# Patient Record
Sex: Male | Born: 1995 | Race: White | Hispanic: No | Marital: Single | Smoking: Former smoker
Health system: Southern US, Community
[De-identification: ages and names within clinical notes are randomized; demographics above are authoritative.]

## PROBLEM LIST (undated history)

## (undated) DIAGNOSIS — R569 Unspecified convulsions: Secondary | ICD-10-CM

---
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
## 2013-03-01 ENCOUNTER — Emergency Department: Payer: Self-pay | Admitting: Emergency Medicine

## 2014-08-14 ENCOUNTER — Encounter (HOSPITAL_COMMUNITY): Payer: Self-pay | Admitting: Emergency Medicine

## 2014-08-14 ENCOUNTER — Emergency Department (HOSPITAL_COMMUNITY)
Admission: EM | Admit: 2014-08-14 | Discharge: 2014-08-14 | Disposition: A | Discharge status: Home / Self Care | Payer: Medicaid Other | Attending: Emergency Medicine | Admitting: Emergency Medicine

## 2014-08-14 ENCOUNTER — Emergency Department (HOSPITAL_COMMUNITY): Payer: Medicaid Other

## 2014-08-14 DIAGNOSIS — Y9389 Activity, other specified: Secondary | ICD-10-CM | POA: Diagnosis not present

## 2014-08-14 DIAGNOSIS — W010XXA Fall on same level from slipping, tripping and stumbling without subsequent striking against object, initial encounter: Secondary | ICD-10-CM | POA: Diagnosis not present

## 2014-08-14 DIAGNOSIS — Y990 Civilian activity done for income or pay: Secondary | ICD-10-CM | POA: Insufficient documentation

## 2014-08-14 DIAGNOSIS — Z720 Tobacco use: Secondary | ICD-10-CM | POA: Insufficient documentation

## 2014-08-14 DIAGNOSIS — S6991XA Unspecified injury of right wrist, hand and finger(s), initial encounter: Secondary | ICD-10-CM | POA: Diagnosis not present

## 2014-08-14 DIAGNOSIS — Y9289 Other specified places as the place of occurrence of the external cause: Secondary | ICD-10-CM | POA: Diagnosis not present

## 2014-08-14 MED ORDER — OXYCODONE HCL 5 MG PO TABS
5.0000 mg | ORAL_TABLET | Freq: Once | ORAL | Status: AC
  Administered 2014-08-14: 5 mg via ORAL
  Filled 2014-08-14: qty 1

## 2014-08-14 MED ORDER — CYCLOBENZAPRINE HCL 10 MG PO TABS: 10.0000 mg | ORAL_TABLET | Freq: Two times a day (BID) | ORAL | Status: AC | PRN

## 2014-08-14 NOTE — ED Provider Notes (Signed)
CSN: [id number]     Arrival date & time 08/14/14  1351 History   This chart was scribed for non-physician practitioner, Marlon Peliffany Nickson Middlesworth, PA-C working with Doug SouSam Jacubowitz, MD, by Jarvis Morganaylor Ferguson, ED Scribe. This patient was seen in room WTR6/WTR6 and the patient's care was started at 3:17 PM.    Chief Complaint  Patient presents with  . Hand Pain    Right    The history is provided by the patient. No language interpreter was used.    HPI Comments: Jason Mccormick is a 19 y.o. male who presents to the Emergency Department complaining of constant, moderate, right hand pain due to an injury that occurred earlier today. Pt states he was picking up something at work and his foot got caught on an object and he tripped and fell and landed on his right hand. He notes the pain is worst in his right thumb and right index finger. Pt states it hurts to straighten his fingers. The pain is exacerbated by movement. He has not taken anything. Pt denies any head injury or LOC from the fall. He denies any swelling or numbness.   History reviewed. No pertinent past medical history. History reviewed. No pertinent past surgical history. No family history on file. History  Substance Use Topics  . Smoking status: Current Every Day Smoker -- 1.00 packs/day  . Smokeless tobacco: Not on file  . Alcohol Use: No    Review of Systems  Musculoskeletal: Positive for myalgias and arthralgias. Negative for joint swelling.  All other systems reviewed and are negative.   Allergies  Ibuprofen and Tylenol  Home Medications   Prior to Admission medications   Medication Sig Start Date End Date Taking? Authorizing Provider  cyclobenzaprine (FLEXERIL) 10 MG tablet Take 1 tablet (10 mg total) by mouth 2 (two) times daily as needed for muscle spasms. 08/14/14   Marlon Peliffany Mekhi Lascola, PA-C   Triage Vitals: BP 132/82 mmHg  Pulse 94  Temp(Src) 98.3 F (36.8 C) (Oral)  Resp 18  Ht 5\' 11"  (1.803 m)  Wt 170 lb (77.111 kg)  BMI  23.72 kg/m2  SpO2 100%  Physical Exam  Constitutional: He is oriented to person, place, and time. He appears well-developed and well-nourished. No distress.  HENT:  Head: Normocephalic and atraumatic.  Eyes: Conjunctivae and EOM are normal.  Neck: Neck supple. No tracheal deviation present.  Cardiovascular: Normal rate.   Pulmonary/Chest: Effort normal. No respiratory distress.  Musculoskeletal:       Right hand: He exhibits decreased range of motion, tenderness, bony tenderness and swelling. He exhibits normal two-point discrimination, normal capillary refill, no deformity and no laceration. Normal sensation noted. Decreased strength noted. He exhibits finger abduction and wrist extension trouble.  No snuff box tenderness  Neurological: He is alert and oriented to person, place, and time.  Skin: Skin is warm and dry.  Psychiatric: He has a normal mood and affect. His behavior is normal.  Nursing note and vitals reviewed.   ED Course  Procedures (including critical care time)  DIAGNOSTIC STUDIES: Oxygen Saturation is 100% on RA, normal by my interpretation.    COORDINATION OF CARE: 2:20 PM- Will order right hand x-ray.  Pt advised of plan for treatment and pt agrees.  3:16 PM- Will order medication for the pain and thumb splint that I have advised him to wear for 1-2 weeks. Also advised pt to continue to ice the area.   Labs Review Labs Reviewed - No data to display  Imaging  Review Dg Hand Complete Right  08/14/2014   CLINICAL DATA:  Larey Seat at work.  Pain in right thumb and first finger.  EXAM: RIGHT HAND - COMPLETE 3+ VIEW  COMPARISON:  None.  FINDINGS: Mild overlap of fingers on the lateral view. No acute fracture or dislocation.  IMPRESSION: No acute osseous abnormality.   Electronically Signed   By: Jeronimo Greaves M.D.   On: 08/14/2014 15:07     EKG Interpretation None      MDM   Final diagnoses:  Thumb injury, right, initial encounter   Xray shows no  fracture/dislocation. No snuff box tenderness NIV  Thumb spica wrist splint,  Oxycodone in ED RICE Pt allergic to Tylenol and Motrin Given rx for flexeril and referral to Hand.  19 y.o.Jesson Foskey Doleman's evaluation in the Emergency Department is complete. It has been determined that no acute conditions requiring further emergency intervention are present at this time. The patient/guardian have been advised of the diagnosis and plan. We have discussed signs and symptoms that warrant return to the ED, such as changes or worsening in symptoms.  Vital signs are stable at discharge. Filed Vitals:   08/14/14 1354  BP: 132/82  Pulse: 94  Temp: 98.3 F (36.8 C)  Resp: 18    Patient/guardian has voiced understanding and agreed to follow-up with the PCP or specialist.   I personally performed the services described in this documentation, which was scribed in my presence. The recorded information has been reviewed and is accurate.    Marlon Pel, PA-C 08/14/14 1529  Doug Sou, MD 08/14/14 [phone]

## 2014-08-14 NOTE — Discharge Instructions (Signed)
Finger Sprain  A finger sprain is a tear in one of the strong, fibrous tissues that connect the bones (ligaments) in your finger. The severity of the sprain depends on how much of the ligament is torn. The tear can be either partial or complete.  CAUSES   Often, sprains are a result of a fall or accident. If you extend your hands to catch an object or to protect yourself, the force of the impact causes the fibers of your ligament to stretch too much. This excess tension causes the fibers of your ligament to tear.  SYMPTOMS   You may have some loss of motion in your finger. Other symptoms include:   Bruising.   Tenderness.   Swelling.  DIAGNOSIS   In order to diagnose finger sprain, your caregiver will physically examine your finger or thumb to determine how torn the ligament is. Your caregiver may also suggest an X-ray exam of your finger to make sure no bones are broken.  TREATMENT   If your ligament is only partially torn, treatment usually involves keeping the finger in a fixed position (immobilization) for a short period. To do this, your caregiver will apply a bandage, cast, or splint to keep your finger from moving until it heals. For a partially torn ligament, the healing process usually takes 2 to 3 weeks.  If your ligament is completely torn, you may need surgery to reconnect the ligament to the bone. After surgery a cast or splint will be applied and will need to stay on your finger or thumb for 4 to 6 weeks while your ligament heals.  HOME CARE INSTRUCTIONS   Keep your injured finger elevated, when possible, to decrease swelling.   To ease pain and swelling, apply ice to your joint twice a day, for 2 to 3 days:   Put ice in a plastic bag.   Place a towel between your skin and the bag.   Leave the ice on for 15 minutes.   Only take over-the-counter or prescription medicine for pain as directed by your caregiver.   Do not wear rings on your injured finger.   Do not leave your finger unprotected  until pain and stiffness go away (usually 3 to 4 weeks).   Do not allow your cast or splint to get wet. Cover your cast or splint with a plastic bag when you shower or bathe. Do not swim.   Your caregiver may suggest special exercises for you to do during your recovery to prevent or limit permanent stiffness.  SEEK IMMEDIATE MEDICAL CARE IF:   Your cast or splint becomes damaged.   Your pain becomes worse rather than better.  MAKE SURE YOU:   Understand these instructions.   Will watch your condition.   Will get help right away if you are not doing well or get worse.  Document Released: 04/13/2004 Document Revised: 05/29/2011 Document Reviewed: 11/07/2010  [employer] Patient Information 2015 [employer]. This information is not intended to replace advice given to you by your health care provider. Make sure you discuss any questions you have with your health care provider.

## 2014-08-14 NOTE — ED Notes (Signed)
Ortho tech aware of order.  

## 2014-08-14 NOTE — ED Notes (Signed)
Pt stated he was picking up something, foot got caught on something, tripped and fell on R hand.

## 2014-08-14 NOTE — ED Notes (Signed)
Pt refused wound care, sts he washed it in the sink "i don't want anyone else touching it"

## 2014-11-11 IMAGING — CT CT HEAD WITHOUT CONTRAST
2 series · 16 of 30 positions shown, 20 images · non-contrast
Comparison: none

REASON FOR EXAM: painand decreased mvmt right arm post seizure
COMMENTS:   May transport without cardiac monitor

PROCEDURE:     CT  - CT HEAD WITHOUT CONTRAST  - October 17, 2012  [DATE]
RESULT:     Comparison:  04/28/2012, MRI 06/03/2012
TECHNIQUE: Multiple axial images from the foramen magnum to the vertex were
obtained without IV contrast.

[Series 2: without · axial · non-contrast · 0.44mm/px · z∈[-124,+12]mm · 13 of 33 slices shown, 17 images]
[im 3/33  brain]
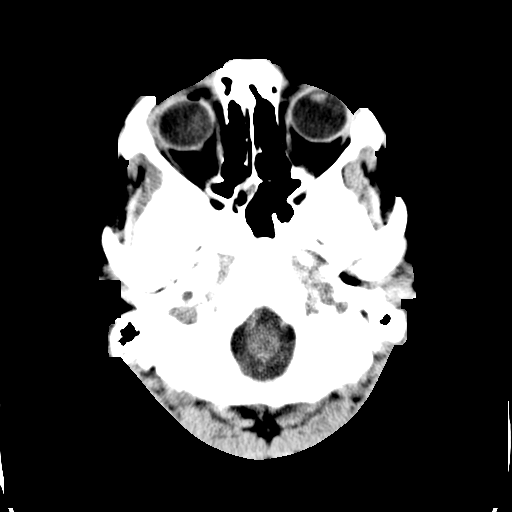
[im 3/33  bone]
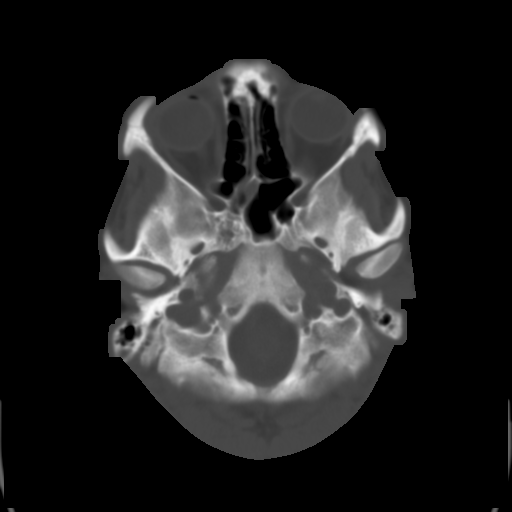
[im 5/33  brain]
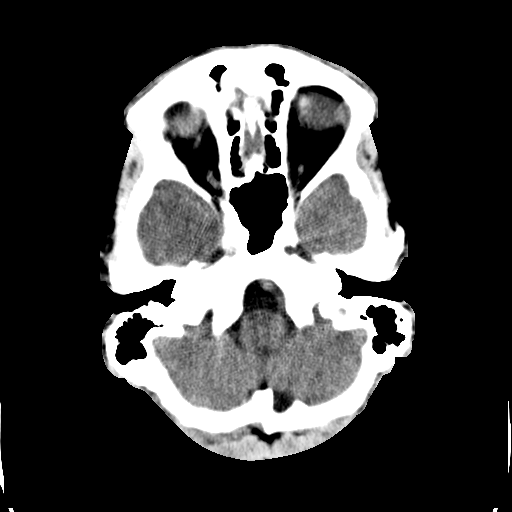
[im 7/33  brain]
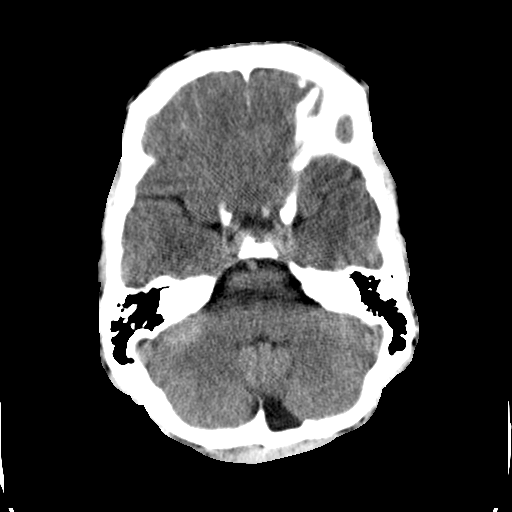
[im 10/33  brain]
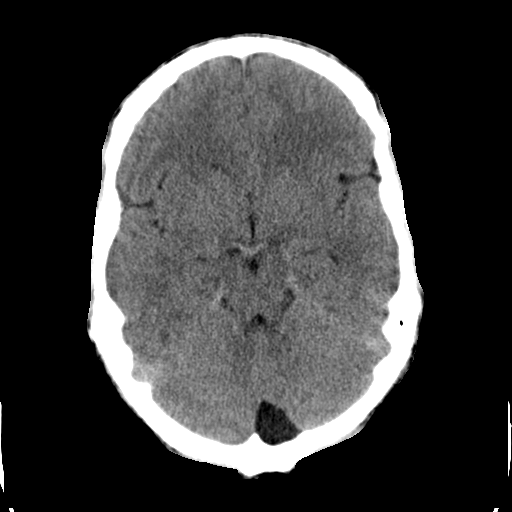
[im 12/33  brain]
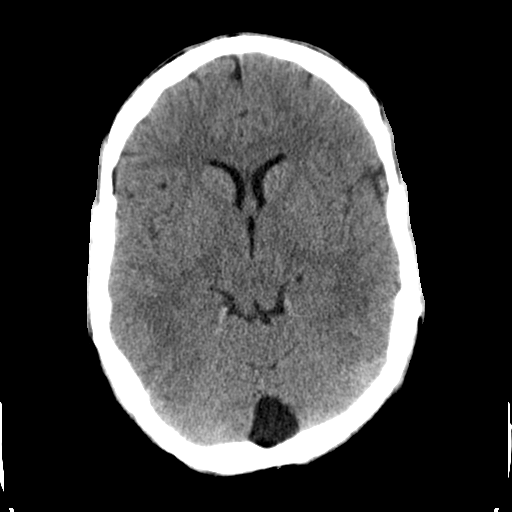
[im 12/33  bone]
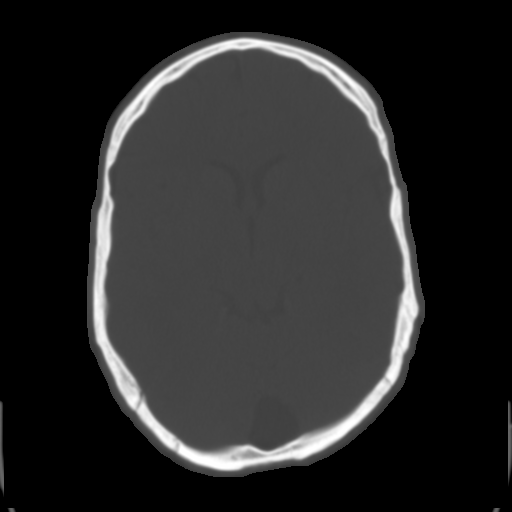
[im 14/33  brain]
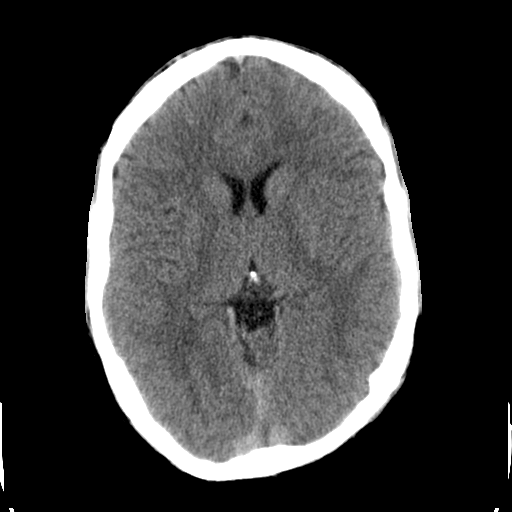
[im 17/33  brain]
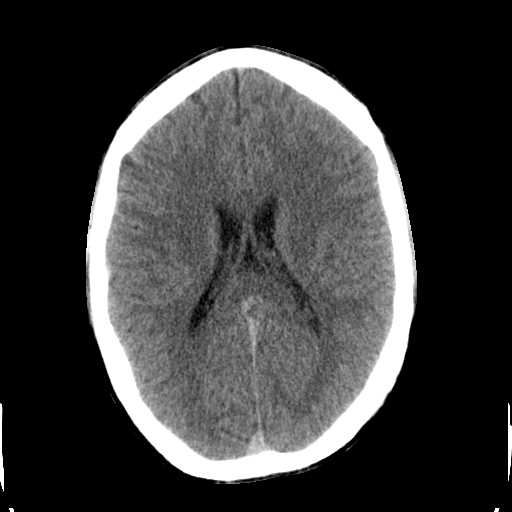
[im 19/33  brain]
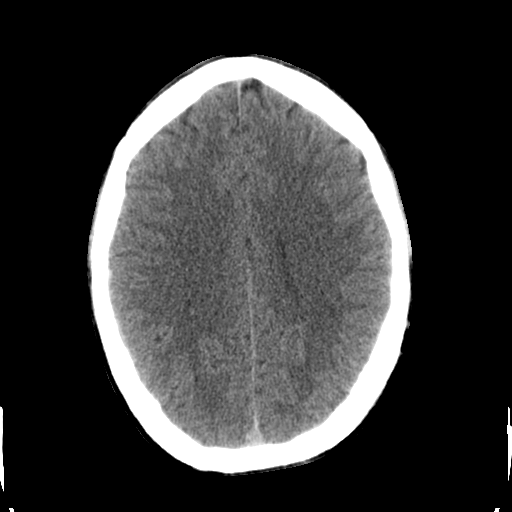
[im 21/33  brain]
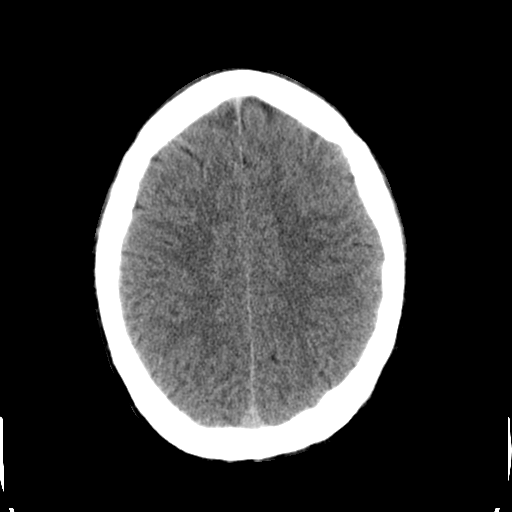
[im 21/33  bone]
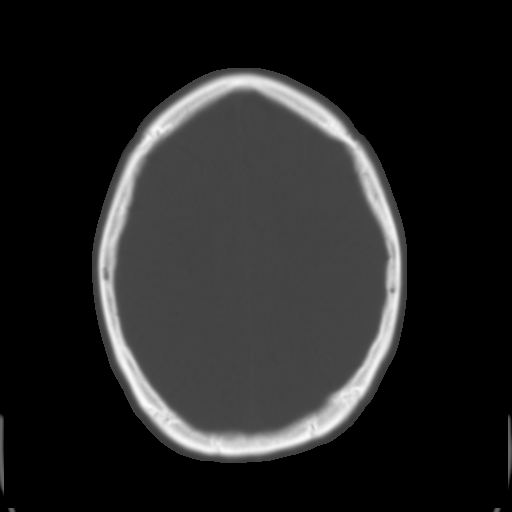
[im 23/33  brain]
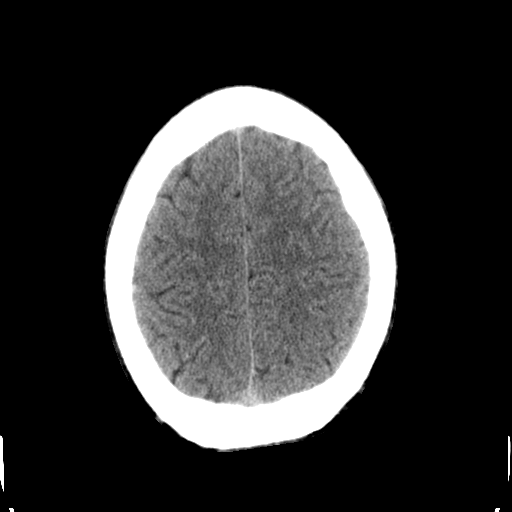
[im 26/33  brain]
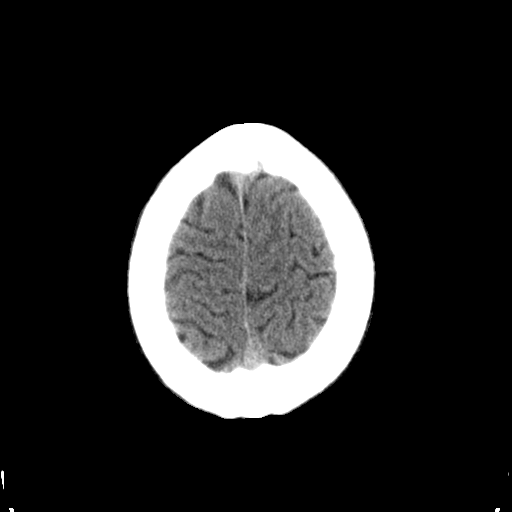
[im 28/33  brain]
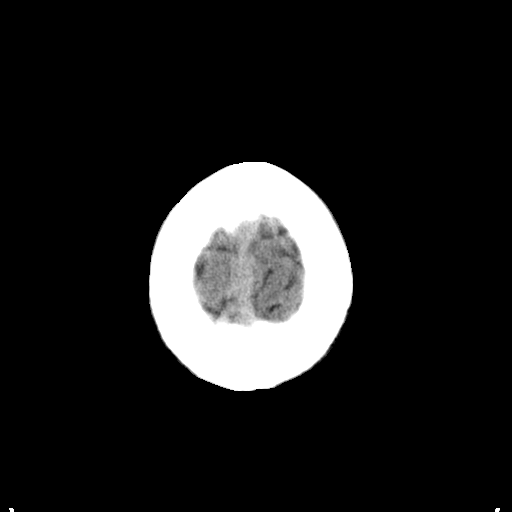
[im 30/33  brain]
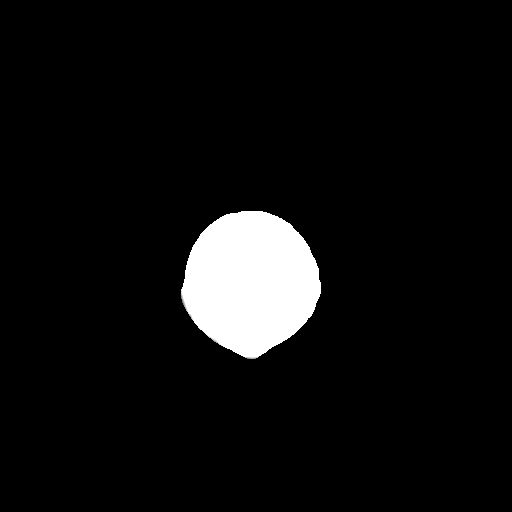
[im 30/33  bone]
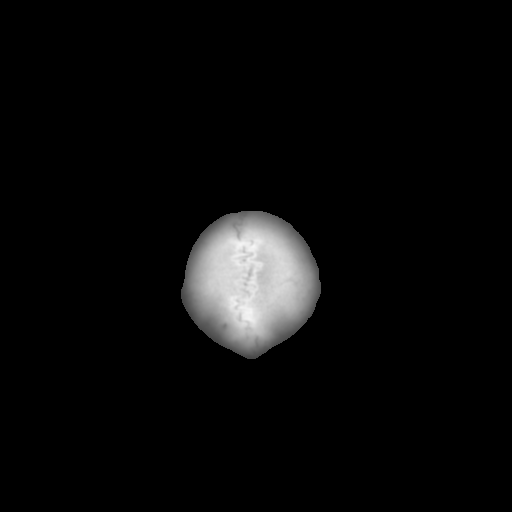

[Series 3: bone · axial · 0.44mm/px · z∈[-124,-78]mm · 3 of 33 slices shown]
[im 3/33  bone]
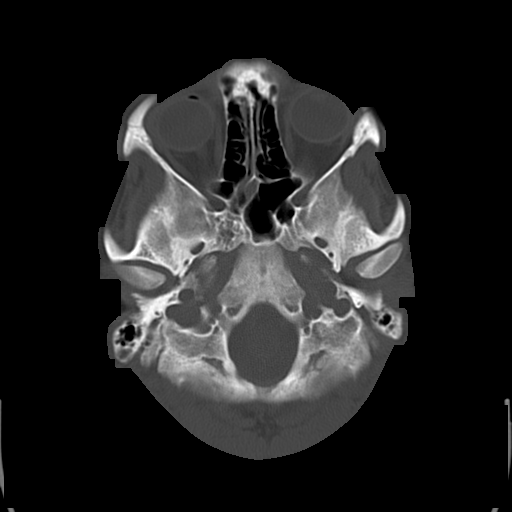
[im 7/33  bone]
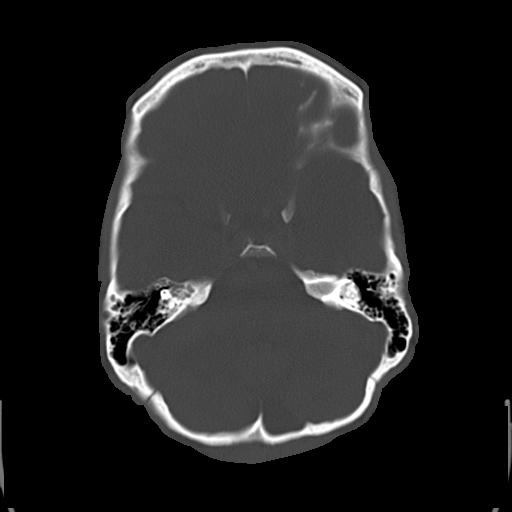
[im 12/33  bone]
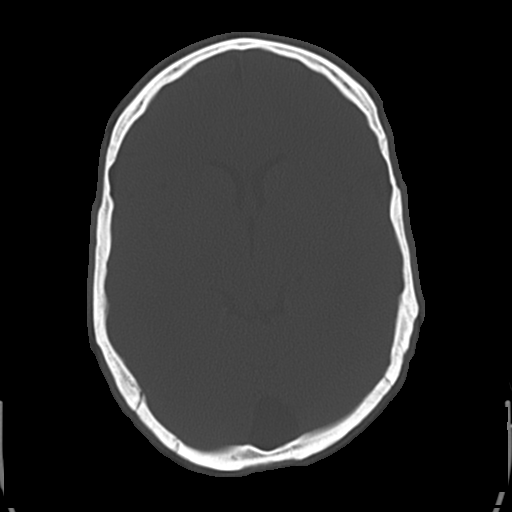

[16 of 30 positions shown; findings below may reference images not displayed]

FINDINGS: There is no evidence for midline shift or extra-axial fluid collections.
There is no evidence for intracranial hemorrhage or cortical-based area of
infarction. There are findings which likely represent a megacisterna magna
versus less likely an arachnoid cyst in the posterior fossa. This is similar
to prior.

The osseous structures are unremarkable.
IMPRESSION: 1. No acute intracranial findings.
2. There is likely a megacisterna magna versus less likely an arachnoid cyst
in the posterior fossa, similar to prior.

## 2015-04-01 ENCOUNTER — Encounter (HOSPITAL_COMMUNITY): Payer: Self-pay | Admitting: Nurse Practitioner

## 2015-04-01 ENCOUNTER — Emergency Department (HOSPITAL_COMMUNITY)
Admission: EM | Admit: 2015-04-01 | Discharge: 2015-04-01 | Discharge status: Against Medical Advice | Payer: Medicaid Other | Attending: Emergency Medicine | Admitting: Emergency Medicine

## 2015-04-01 DIAGNOSIS — Y9389 Activity, other specified: Secondary | ICD-10-CM | POA: Insufficient documentation

## 2015-04-01 DIAGNOSIS — Y998 Other external cause status: Secondary | ICD-10-CM | POA: Insufficient documentation

## 2015-04-01 DIAGNOSIS — X58XXXA Exposure to other specified factors, initial encounter: Secondary | ICD-10-CM | POA: Insufficient documentation

## 2015-04-01 DIAGNOSIS — Y9289 Other specified places as the place of occurrence of the external cause: Secondary | ICD-10-CM | POA: Insufficient documentation

## 2015-04-01 DIAGNOSIS — S01112A Laceration without foreign body of left eyelid and periocular area, initial encounter: Secondary | ICD-10-CM | POA: Insufficient documentation

## 2015-04-01 DIAGNOSIS — F17200 Nicotine dependence, unspecified, uncomplicated: Secondary | ICD-10-CM | POA: Insufficient documentation

## 2015-04-01 HISTORY — DX: Unspecified convulsions: R56.9

## 2015-04-01 NOTE — ED Notes (Addendum)
Pt was outside when a rock flew from under a mower into his left eye. He reports profuse bleeding at onset, hes been holding pressure to eye with tissue, he states eh is having difficulty opening his eye due to pain and can not see as far as normal. He c/o moderate pain. Bruising and laceration noted under L eye, oozing blood, he will not open his eye for exam. He denies LOC. he is A&Ox4,resp e/u

## 2015-04-01 NOTE — ED Notes (Signed)
Patient called in main ED waiting area with no response 

## 2015-04-01 NOTE — ED Notes (Signed)
Patient called again x2 in main ED waiting area with no response 

## 2016-09-07 IMAGING — CR DG HAND COMPLETE 3+V*R*
3 series · 3 of 3 positions shown · non-contrast
Comparison: None.

CLINICAL DATA: Fell at work.  Pain in right thumb and first finger.

EXAM:
RIGHT HAND - COMPLETE 3+ VIEW

[x hand pa right]
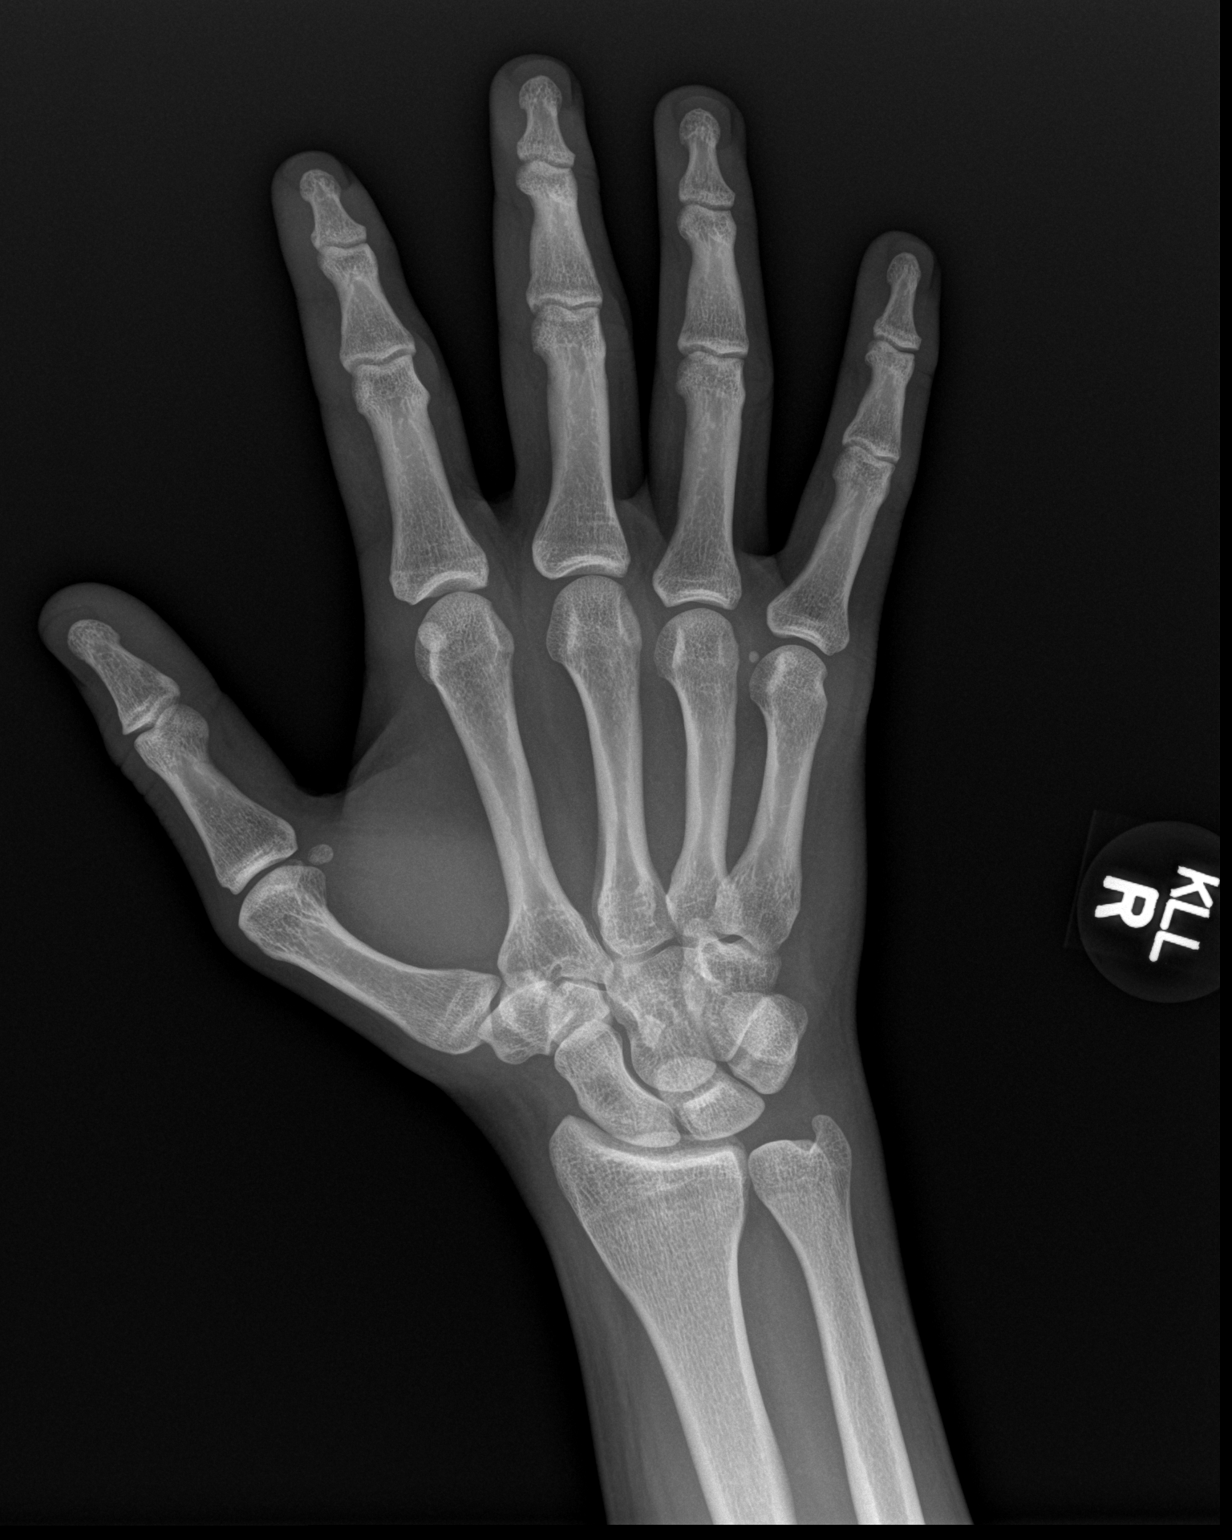

[x hand obl right]
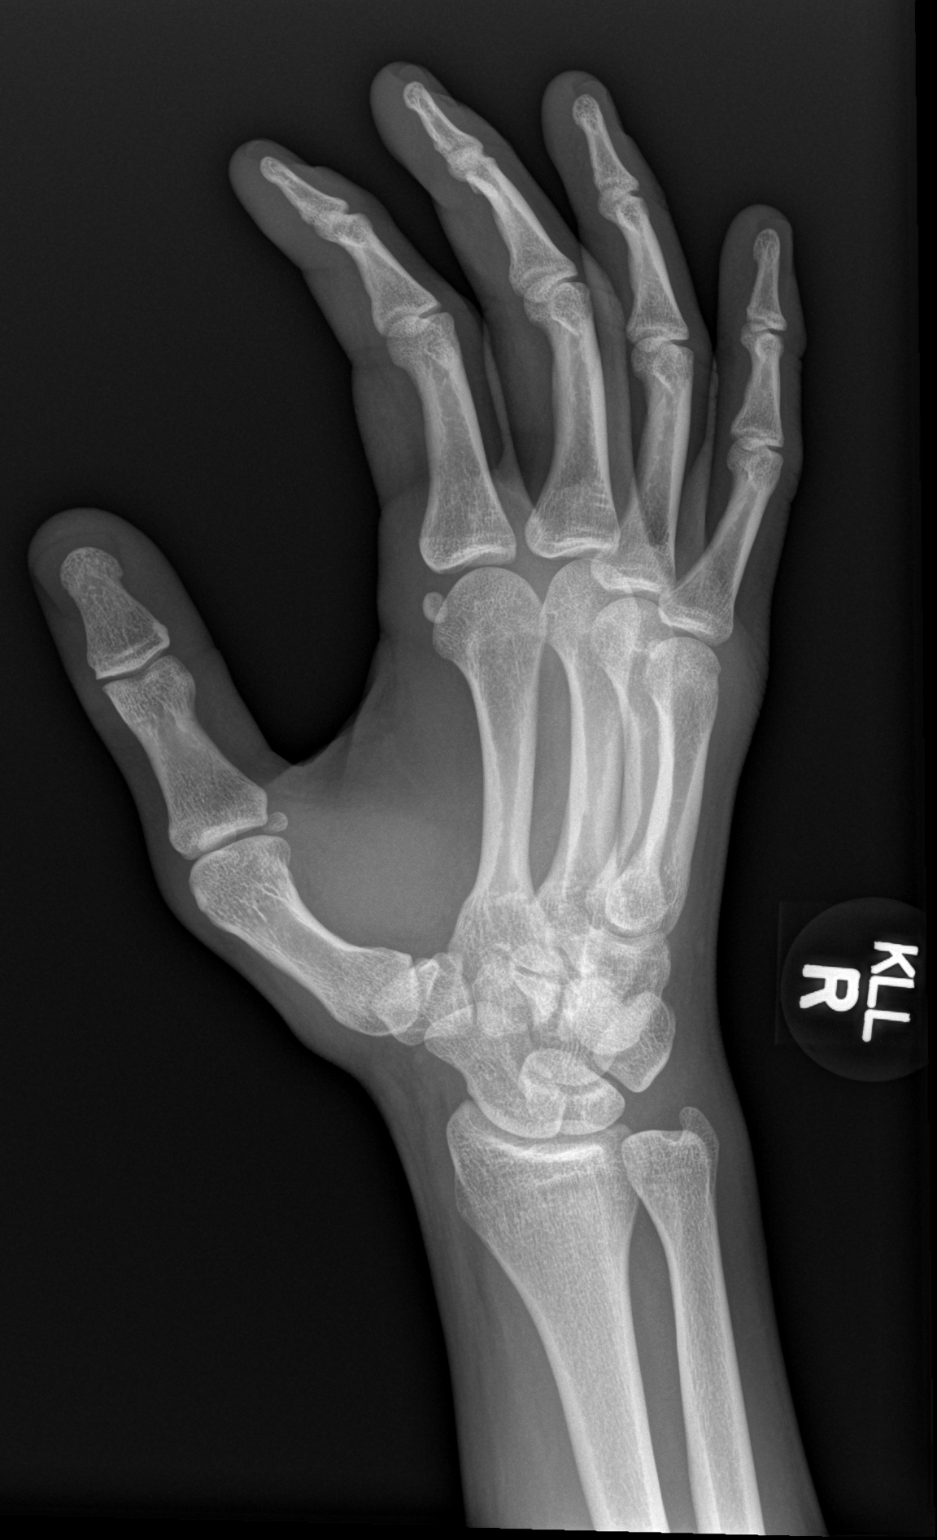

[x hand lat right]
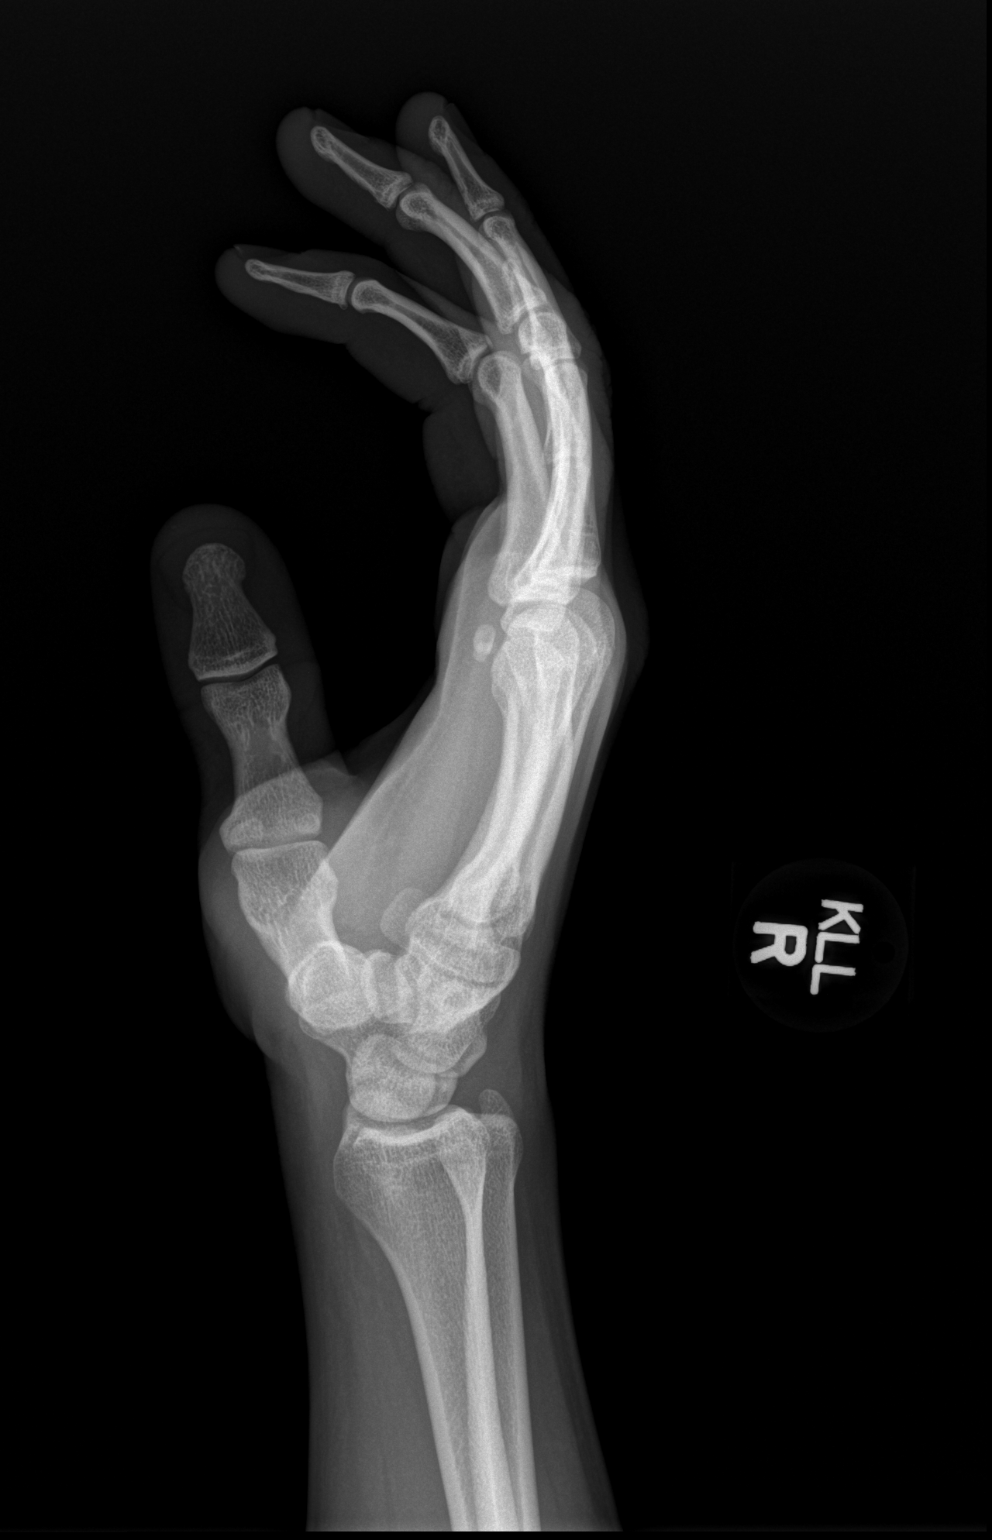

[3 of 3 positions shown; findings below may reference images not displayed]

FINDINGS: Mild overlap of fingers on the lateral view. No acute fracture or
dislocation.
IMPRESSION: No acute osseous abnormality.

## 2022-01-15 ENCOUNTER — Ambulatory Visit
Admission: EM | Admit: 2022-01-15 | Discharge: 2022-01-15 | Disposition: A | Discharge status: Home / Self Care | Payer: Self-pay | Attending: Emergency Medicine | Admitting: Emergency Medicine

## 2022-01-15 DIAGNOSIS — K047 Periapical abscess without sinus: Secondary | ICD-10-CM

## 2022-01-15 MED ORDER — LEVOFLOXACIN 750 MG PO TABS: 750.0000 mg | ORAL_TABLET | Freq: Every day | ORAL | 0 refills | Status: AC

## 2022-01-15 MED ORDER — METRONIDAZOLE 500 MG PO TABS: 500.0000 mg | ORAL_TABLET | Freq: Three times a day (TID) | ORAL | 0 refills | Status: AC

## 2022-01-15 NOTE — Discharge Instructions (Signed)
For treatment of the infection in your right lower back tooth, please begin 2 antibiotics for complete resolution.  The first 1 is levofloxacin that you will take once daily for 14 days.  The second is metronidazole that you will take 3 times daily for 14 days.  Please reach out to your dentist soon as possible to make sure that your tooth is evaluated and treated.  Failure to do so can only result in a repeat infection.  Thank you for visiting urgent care today.

## 2022-01-15 NOTE — ED Triage Notes (Signed)
Pt presents with right side jaw pain and dental pain X 2 days.

## 2022-01-15 NOTE — ED Provider Notes (Signed)
[facility]    CSN: [id number] Arrival date & time: 01/15/22  1231    HISTORY   Chief Complaint  Patient presents with   Jaw Pain   Dental Pain   HPI Jason Mccormick is a pleasant, 26 y.o. male who presents to urgent care today. Presents to urgent care today complaining of jaw pain on the right as well as dental pain for the past 2 days.  Patient states he has not had any ear pain however is concerned that it might be related to an infection in his ear.  He also states he is concerned it might be a sinus infection however he has not been experiencing any rhinorrhea or nasal congestion, eye pain, headache.  The history is provided by the patient.  Dental Pain Location:  Lower Lower teeth location:  32/RL 3rd molar Quality:  Constant, throbbing, sharp and radiating Severity:  Severe Onset quality:  Gradual Duration:  2 days Timing:  Constant Progression:  Worsening Chronicity:  New Context: abscess and poor dentition   Prior workup: none. Relieved by:  NSAIDs Worsened by:  Cold food/drink, hot food/drink, touching, jaw movement and pressure Ineffective treatments:  Acetaminophen Associated symptoms: facial pain and facial swelling   Associated symptoms: no congestion, no difficulty swallowing, no drooling, no fever, no gum swelling, no headaches, no neck pain, no neck swelling, no oral bleeding, no oral lesions and no trismus   Risk factors: lack of dental care and smoking    Past Medical History:  Diagnosis Date   Seizures ([place])    There are no problems to display for this patient.  History reviewed. No pertinent surgical history.  Home Medications    Prior to Admission medications   Medication Sig Start Date End Date Taking? Authorizing Provider  cyclobenzaprine (FLEXERIL) 10 MG tablet Take 1 tablet (10 mg total) by mouth 2 (two) times daily as needed for muscle spasms. 08/14/14   Delos Haring, PA-C    Family History History reviewed. No  pertinent family history. Social History Social History   Tobacco Use   Smoking status: Every Day    Packs/day: 1.00    Types: Cigarettes  Substance Use Topics   Alcohol use: No   Drug use: Yes    Types: Marijuana   Allergies   Amoxicillin, Ibuprofen, and Tylenol [acetaminophen]  Review of Systems Review of Systems  Constitutional:  Negative for fever.  HENT:  Positive for facial swelling. Negative for congestion, drooling and mouth sores.   Musculoskeletal:  Negative for neck pain.  Neurological:  Negative for headaches.   Pertinent findings revealed after performing a 14 point review of systems has been noted in the history of present illness.  Physical Exam Triage Vital Signs ED Triage Vitals  Enc Vitals Group     BP 01/14/21 0827 (!) 147/82     Pulse Rate 01/14/21 0827 72     Resp 01/14/21 0827 18     Temp 01/14/21 0827 98.3 F (36.8 C)     Temp Source 01/14/21 0827 Oral     SpO2 01/14/21 0827 98 %     Weight --      Height --      Head Circumference --      Peak Flow --      Pain Score 01/14/21 0826 5     Pain Loc --      Pain Edu? --      Excl. in [place]? --   No data found.  Updated Vital Signs BP (!) 155/110 (BP Location: Left Arm)   Pulse (!) 112   Temp 98.1 F (36.7 C) (Oral)   Resp 20   SpO2 98%   Physical Exam Vitals and nursing note reviewed.  Constitutional:      General: He is awake. He is not in acute distress.    Appearance: Normal appearance. He is well-developed and normal weight. He is not ill-appearing.     Comments: Appearance is disheveled, hair is unwashed, patient smells of cannabis  HENT:     Head: Normocephalic and atraumatic.     Mouth/Throat:     Lips: Pink.     Mouth: Mucous membranes are moist.     Dentition: Abnormal dentition. Dental tenderness, gingival swelling, dental caries and dental abscesses present.   Eyes:     Extraocular Movements: Extraocular movements intact.     Conjunctiva/sclera: Conjunctivae normal.      Pupils: Pupils are equal, round, and reactive to light.  Cardiovascular:     Rate and Rhythm: Normal rate and regular rhythm.  Pulmonary:     Effort: Pulmonary effort is normal.     Breath sounds: Normal breath sounds.  Musculoskeletal:        General: Normal range of motion.     Cervical back: Normal range of motion and neck supple.  Skin:    General: Skin is warm and dry.  Neurological:     General: No focal deficit present.     Mental Status: He is alert and oriented to person, place, and time. Mental status is at baseline.  Psychiatric:        Mood and Affect: Mood normal.        Behavior: Behavior normal. Behavior is cooperative.        Thought Content: Thought content normal.        Judgment: Judgment normal.     Visual Acuity Right Eye Distance:   Left Eye Distance:   Bilateral Distance:    Right Eye Near:   Left Eye Near:    Bilateral Near:     UC Couse / Diagnostics / Procedures:     Radiology No results found.  Procedures Procedures (including critical care time) EKG  Pending results:  Labs Reviewed - No data to display  Medications Ordered in UC: Medications - No data to display  UC Diagnoses / Final Clinical Impressions(s)   I have reviewed the triage vital signs and the nursing notes.  Pertinent labs & imaging results that were available during my care of the patient were reviewed by me and considered in my medical decision making (see chart for details).    Final diagnoses:  Dental abscess   Patient's history of smoking, marijuana use and poor personal hygiene as well as the extensive swelling on the right lower jaw from his tragus down to his chin, will treat patient for periodontal abscess with Levaquin and metronidazole for coverage of MRSA and other atypical bacteria.  Patient strongly encouraged to reach out to a dentist to have it evaluated and treated.  Patient politely declines my offer for pain medication during his visit today.  States  he will continue to take ibuprofen.  ED Prescriptions     Medication Sig Dispense Auth. Provider   metroNIDAZOLE (FLAGYL) 500 MG tablet Take 1 tablet (500 mg total) by mouth 3 (three) times daily for 14 days. 42 tablet Theadora Rama Scales, PA-C   levofloxacin (LEVAQUIN) 750 MG tablet Take 1 tablet (750 mg total)  by mouth daily for 14 days. 14 tablet Theadora Rama Scales, PA-C      PDMP not reviewed this encounter.  Pending results:  Labs Reviewed - No data to display  Discharge Instructions:   Discharge Instructions      For treatment of the infection in your right lower back tooth, please begin 2 antibiotics for complete resolution.  The first 1 is levofloxacin that you will take once daily for 14 days.  The second is metronidazole that you will take 3 times daily for 14 days.  Please reach out to your dentist soon as possible to make sure that your tooth is evaluated and treated.  Failure to do so can only result in a repeat infection.  Thank you for visiting urgent care today.      Disposition Upon Discharge:  Condition: stable for discharge home  Patient presented with an acute illness with associated systemic symptoms and significant discomfort requiring urgent management. In my opinion, this is a condition that a prudent lay person (someone who possesses an average knowledge of health and medicine) may potentially expect to result in complications if not addressed urgently such as respiratory distress, impairment of bodily function or dysfunction of bodily organs.   Routine symptom specific, illness specific and/or disease specific instructions were discussed with the patient and/or caregiver at length.   As such, the patient has been evaluated and assessed, work-up was performed and treatment was provided in alignment with urgent care protocols and evidence based medicine.  Patient/parent/caregiver has been advised that the patient may require follow up for  further testing and treatment if the symptoms continue in spite of treatment, as clinically indicated and appropriate.  Patient/parent/caregiver has been advised to return to the [facility] or PCP if no better; to PCP or the Emergency Department if new signs and symptoms develop, or if the current signs or symptoms continue to change or worsen for further workup, evaluation and treatment as clinically indicated and appropriate  The patient will follow up with their current PCP if and as advised. If the patient does not currently have a PCP we will assist them in obtaining one.   The patient may need specialty follow up if the symptoms continue, in spite of conservative treatment and management, for further workup, evaluation, consultation and treatment as clinically indicated and appropriate.   Patient/parent/caregiver verbalized understanding and agreement of plan as discussed.  All questions were addressed during visit.  Please see discharge instructions below for further details of plan.  This office note has been dictated using Teaching laboratory technician.  Unfortunately, this method of dictation can sometimes lead to typographical or grammatical errors.  I apologize for your inconvenience in advance if this occurs.  Please do not hesitate to reach out to me if clarification is needed.      Theadora Rama Scales, [place] 01/16/22 [phone]

## 2023-08-19 ENCOUNTER — Emergency Department (HOSPITAL_BASED_OUTPATIENT_CLINIC_OR_DEPARTMENT_OTHER): Payer: Self-pay | Admitting: Radiology

## 2023-08-19 ENCOUNTER — Encounter (HOSPITAL_BASED_OUTPATIENT_CLINIC_OR_DEPARTMENT_OTHER): Payer: Self-pay | Admitting: Emergency Medicine

## 2023-08-19 ENCOUNTER — Other Ambulatory Visit: Payer: Self-pay

## 2023-08-19 ENCOUNTER — Emergency Department (HOSPITAL_BASED_OUTPATIENT_CLINIC_OR_DEPARTMENT_OTHER)
Admission: EM | Admit: 2023-08-19 | Discharge: 2023-08-20 | Disposition: A | Discharge status: Home / Self Care | Payer: Self-pay | Attending: Emergency Medicine | Admitting: Emergency Medicine

## 2023-08-19 DIAGNOSIS — J111 Influenza due to unidentified influenza virus with other respiratory manifestations: Secondary | ICD-10-CM | POA: Insufficient documentation

## 2023-08-19 DIAGNOSIS — R000 Tachycardia, unspecified: Secondary | ICD-10-CM | POA: Insufficient documentation

## 2023-08-19 LAB — RESP PANEL BY RT-PCR (RSV, FLU A&B, COVID)  RVPGX2
Influenza A by PCR: NEGATIVE
Influenza B by PCR: POSITIVE — AB
Resp Syncytial Virus by PCR: NEGATIVE
SARS Coronavirus 2 by RT PCR: NEGATIVE

## 2023-08-19 MED ORDER — ACETAMINOPHEN 500 MG PO TABS
1000.0000 mg | ORAL_TABLET | Freq: Once | ORAL | Status: AC
  Administered 2023-08-19: 1000 mg via ORAL
  Filled 2023-08-19: qty 2

## 2023-08-19 MED ORDER — BENZONATATE 100 MG PO CAPS: 100.0000 mg | ORAL_CAPSULE | Freq: Three times a day (TID) | ORAL | 0 refills | Status: AC

## 2023-08-19 MED ORDER — LACTATED RINGERS IV BOLUS: 1000.0000 mL | Freq: Once | INTRAVENOUS | Status: DC

## 2023-08-19 MED ORDER — IBUPROFEN 400 MG PO TABS
600.0000 mg | ORAL_TABLET | Freq: Once | ORAL | Status: AC
  Administered 2023-08-19: 600 mg via ORAL
  Filled 2023-08-19: qty 1

## 2023-08-19 NOTE — ED Notes (Signed)
 Pt refusing all labs andEKG. States "I just want my discharge papers" PA Riley at bedside.

## 2023-08-19 NOTE — ED Notes (Signed)
 Pt denies allergies to ibuprofen and tylenol

## 2023-08-19 NOTE — Discharge Instructions (Addendum)
 You were seen in the ER today for evaluation of your cough and cold symptoms. You have the flu. The flu is contagious and please make sure you are quarantining yourself.  Please ensure that you are staying well-hydrated drinking plenty of fluids, mainly water.  Recommend taking over-the-counter cough and cold medication for your symptoms.  He can also try Tylenol or ibuprofen as needed.  As previously discussed he can also try spoonful of honey.  Will send you in some Tessalon Perles that she can take.  We offered you lab work and IV fluids however you declined these.  If you like to continue workup you can return to the ER at any time.  If you have any concerns, new or worsening symptoms, please return to your nearest emerged part for reevaluation. Contact a health care provider if: You get new symptoms. You have chest pain. You have watery poop, also called diarrhea. You have a fever. Your cough gets worse. You start to have more mucus. You feel like you may vomit, or you vomit. Get help right away if: You become short of breath or have trouble breathing. Your skin or nails turn blue. You have very bad pain or stiffness in your neck. You get a sudden headache or pain in your face or ear. You vomit each time you eat or drink. These symptoms may be an emergency. Call 911 right away. Do not wait to see if the symptoms will go away. Do not drive yourself to the hospital.

## 2023-08-19 NOTE — ED Notes (Signed)
 Dc instructions given, pt verbalized understanding. Out of ED with steady gait not in visible distress. Rx sent to pt pharmacy.

## 2023-08-19 NOTE — ED Triage Notes (Signed)
 Cough x 3 days Hurts to cough Coughing up yellow

## 2023-08-19 NOTE — ED Notes (Signed)
 Medicated per emar for fever. Awaiting dispo.

## 2023-08-19 NOTE — ED Provider Notes (Signed)
  EMERGENCY DEPARTMENT AT [facility] Provider Note   CSN: [id number] Arrival date & time: 08/19/23  1941     History Chief Complaint  Patient presents with   Cough    Jason Mccormick is a 28 y.o. male self reportedly otherwise healthy presents emerged from today for evaluation of cough for the past 3 days.  Reports productive with white and green mucus but denies any hemoptysis.  Reports he had does have some chest pain but only with coughing.  Only has some shortness of breath with multiple coughing episodes as well.  Does not feel shortness of breath now.  Denies any leg swelling.  Unsure fever reports he is been having some bodyaches.  Reports some rhinorrhea and nasal congestion as well.  Denies any sore throat, nausea, vomiting, abdominal pain, earache.  Has been taking "Children's Motrin"  at home for his symptoms.  Allergic to penicillin.  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   Cough Associated symptoms: fever, myalgias and rhinorrhea   Associated symptoms: no chills, no ear pain, no headaches and no sore throat        Home Medications Prior to Admission medications   Medication Sig Start Date End Date Taking? Authorizing Provider  cyclobenzaprine  (FLEXERIL ) 10 MG tablet Take 1 tablet (10 mg total) by mouth 2 (two) times daily as needed for muscle spasms. 08/14/14   Amil Balding, PA-C      Allergies    Penicillins and Amoxicillin    Review of Systems   Review of Systems  Constitutional:  Positive for fever. Negative for chills.  HENT:  Positive for rhinorrhea. Negative for ear pain and sore throat.   Eyes:  Negative for visual disturbance.  Respiratory:  Positive for cough.   Cardiovascular:  Negative for palpitations and leg swelling.  Gastrointestinal:  Negative for abdominal pain, constipation, diarrhea, nausea and vomiting.  Genitourinary:  Negative for dysuria.  Musculoskeletal:  Positive for myalgias.  Neurological:  Negative for  headaches.  See HPI  Physical Exam Updated Vital Signs BP 125/80 (BP Location: Right Arm)   Pulse (!) 108   Temp 99.5 F (37.5 C) (Oral)   Resp 16   Ht 6' (1.829 m)   Wt 106.6 kg   SpO2 100%   BMI 31.87 kg/m  Physical Exam Vitals and nursing note reviewed.  Constitutional:      General: He is not in acute distress.    Appearance: He is not ill-appearing or toxic-appearing.  HENT:     Right Ear: Tympanic membrane, ear canal and external ear normal.     Left Ear: Tympanic membrane, ear canal and external ear normal.     Mouth/Throat:     Mouth: Mucous membranes are moist.     Pharynx: No oropharyngeal exudate or posterior oropharyngeal erythema.     Comments: Moist mucous membranes.  Uvula midline.  Airway patent.  No pharyngeal erythema, edema, or exudate noted.  Controlling secretions.  Normal phonation. Eyes:     General: No scleral icterus. Cardiovascular:     Rate and Rhythm: Tachycardia present.  Pulmonary:     Effort: Pulmonary effort is normal. No respiratory distress.     Breath sounds: Normal breath sounds. No stridor. No rhonchi.  Chest:     Chest wall: Tenderness present.  Abdominal:     Palpations: Abdomen is soft.     Tenderness: There is no abdominal tenderness.  Musculoskeletal:     Cervical back: Normal range of motion.  No rigidity.  Skin:    General: Skin is warm and dry.  Neurological:     Mental Status: He is alert.     ED Results / Procedures / Treatments   Labs (all labs ordered are listed, but only abnormal results are displayed) Labs Reviewed  RESP PANEL BY RT-PCR (RSV, FLU A&B, COVID)  [id number] - Abnormal; Notable for the following components:      Result Value   Influenza B by PCR POSITIVE (*)    All other components within normal limits  CBC WITH DIFFERENTIAL/PLATELET  COMPREHENSIVE METABOLIC PANEL WITH GFR  TROPONIN T, HIGH SENSITIVITY    EKG None  Radiology DG Chest 2 View Result Date: 08/19/2023 EXAM: 2 VIEW(S) XRAY OF THE  CHEST 08/19/2023 08:36:24 PM COMPARISON: 01/13/2009 CLINICAL HISTORY: SOB. Cough x 3 days. Hurts to cough. Coughing up yellow. FINDINGS: LUNGS AND PLEURA: No focal pulmonary opacity. No pulmonary edema. No pleural effusion. No pneumothorax. HEART AND MEDIASTINUM: No acute abnormality of the cardiac and mediastinal silhouettes. BONES AND SOFT TISSUES: No acute osseous abnormality. IMPRESSION: 1. No acute process. Electronically signed by: Zadie Herter MD 08/19/2023 08:39 PM EDT RP Workstation: [id number]    Procedures Procedures   Medications Ordered in ED Medications  lactated ringers  bolus 1,000 mL (has no administration in time range)  acetaminophen  (TYLENOL ) tablet 1,000 mg (1,000 mg Oral Given 08/19/23 2212)  ibuprofen  (ADVIL ) tablet 600 mg (600 mg Oral Given 08/19/23 2212)    ED Course/ Medical Decision Making/ A&P                                Medical Decision Making Amount and/or Complexity of Data Reviewed Labs: ordered. Radiology: ordered.  Risk OTC drugs. Prescription drug management.  28 y.o. male presents to the ER for evaluation of FLS. Differential diagnosis includes but is not limited to viral illness, COVID, flu, RSV, bronchitis, PNA. Vital signs tachycardia at 108, mildly elevated temp at 99.33F. Physical exam as noted above.   I independently reviewed and interpreted the patient's labs. Flu B positive.  CXR shows 1. No acute process. Per radiologist's interpretation.    Given patient still tachycardia, will need to obtain labs to check CBC, CMP, lactic.  Patient may have some dehydration from fever.  Patient is declining all labs.  Declining fluids.  He reports that his heart rate is always elevated.  From previous chart evaluation, does appear his heart rate has been elevated with his last ER visit and even the ER visit 8 years ago was on 112.  He reports that he just wanted to know if he had pneumonia.  He is only having chest pain with coughing and only having  short of breath with long spells of coughing.  His chest is tender from palpation and his lungs are clear to auscultation.  Patient assures me if he starts to not feel well he will return to the ER.  We discussed the results of the labs/imaging. The plan is supportive care, strict return precautions. We discussed strict return precautions and red flag symptoms. The patient verbalized their understanding and agrees to the plan. The patient is stable and being discharged home in good condition.  I discussed this case with my attending physician who cosigned this note including patient's presenting symptoms, physical exam, and planned diagnostics and interventions. Attending physician stated agreement with plan or made changes to plan which were implemented.   Portions of  this report may have been transcribed using voice recognition software. Every effort was made to ensure accuracy; however, inadvertent computerized transcription errors may be present.    Final Clinical Impression(s) / ED Diagnoses Final diagnoses:  Flu    Rx / DC Orders ED Discharge Orders          Ordered    benzonatate  (TESSALON ) 100 MG capsule  Every 8 hours        08/19/23 2343              Spence Dux, PA-C 08/22/23 1816    Sallyanne Creamer, DO 08/27/23 1507
